# Patient Record
Sex: Male | Born: 1995
Health system: Southern US, Community
[De-identification: ages and names within clinical notes are randomized; demographics above are authoritative.]

## PROBLEM LIST (undated history)

## (undated) DIAGNOSIS — H539 Unspecified visual disturbance: Secondary | ICD-10-CM

## (undated) DIAGNOSIS — R55 Syncope and collapse: Secondary | ICD-10-CM

## (undated) DIAGNOSIS — K589 Irritable bowel syndrome without diarrhea: Secondary | ICD-10-CM

## (undated) HISTORY — DX: Irritable bowel syndrome without diarrhea: K58.9

---
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
## 2020-07-02 ENCOUNTER — Other Ambulatory Visit: Payer: Self-pay

## 2020-07-02 ENCOUNTER — Ambulatory Visit
Admission: RE | Admit: 2020-07-02 | Discharge: 2020-07-02 | Disposition: A | Discharge status: Home / Self Care | Payer: BC Managed Care – PPO | Source: Ambulatory Visit | Attending: Emergency Medicine | Admitting: Emergency Medicine

## 2020-07-02 VITALS — BP 121/76 | HR 60 | Temp 98.4°F | Resp 16

## 2020-07-02 DIAGNOSIS — H6123 Impacted cerumen, bilateral: Secondary | ICD-10-CM

## 2020-07-02 DIAGNOSIS — H6121 Impacted cerumen, right ear: Secondary | ICD-10-CM | POA: Diagnosis not present

## 2020-07-02 NOTE — Discharge Instructions (Addendum)
Below is a list of primary care practices who are taking new patients for you to follow-up with.  [facility] internal medicine clinic Ground Floor - [facility], [address] [place], [place], [place] [address] [phone]  [facility] Primary Care at [facility] [address] [place], [place] [address] [phone]  [facility] [address] [place], [place] [address] [phone]  [facility] [phone] [address] [place] [address]  [facility]: [address] [place] [address]  [phone]  [facility]: [address] [place] [address]   [phone]  [facility]: [address] [place] [address]  [phone]  [facility] : [address] [place] [address] [phone]  [facility] Primary [facility]: [address] [place] [address] [phone]  [facility]: [address] [place] [address] [phone]  Dr. Oneal Grout [address] [facility] [place] [address]  [phone]  Dr. Jackie Plum, [facility]. [address] [place], [place] [address]  [phone]  Go to www.goodrx.com to look up your medications. This will give you a list of where you can find your prescriptions at the most affordable prices. Or ask the pharmacist what the cash price is, or if they have any other discount programs available to help make your medication more affordable. This can be less expensive than what you would pay with insurance.

## 2020-07-02 NOTE — ED Provider Notes (Signed)
HPI  SUBJECTIVE:  Jacob Hernandez is a 25 y.o. male who presents with a sensation of his right ear being "clogged" with muffled hearing for the past 2 days.  He denies ear pain, otorrhea, vertigo, foreign body insertion, nasal congestion.  He tried an earwax removal kit with partial relief of his symptoms, but he states that he felt as if the wax went deeper into his ear.  No complaints in the left ear. he has no past medical history.  PMD: None.    Past Medical History:  Diagnosis Date  . IBS (irritable bowel syndrome)   . IBS (irritable bowel syndrome)   . Syncope    takes Midodrine.  Neurocardiogenic Syncope  . Vision abnormalities    weaqrs contacts    Past Surgical History:  Procedure Laterality Date  . ESOPHAGOGASTRODUODENOSCOPY N/A 12/17/2012   Procedure: ESOPHAGOGASTRODUODENOSCOPY (EGD);  Surgeon: Oletha Blend, MD;  Location: [facility];  Service: Gastroenterology;  Laterality: N/A;    Family History  Problem Relation Age of Onset  . GER disease Maternal Grandmother   . Cholelithiasis Maternal Grandmother   . Arthritis Maternal Grandmother   . Cancer Maternal Grandmother   . Hypertension Maternal Grandmother   . Depression Mother   . Mental illness Mother   . Mental illness Maternal Aunt   . Heart disease Paternal Grandfather   . Hypertension Paternal Grandfather     Social History   Tobacco Use  . Smoking status: Never Smoker  . Smokeless tobacco: Never Used  Substance Use Topics  . Alcohol use: No  . Drug use: No    No current facility-administered medications for this encounter.  Current Outpatient Medications:  .  acetaminophen (TYLENOL) 500 MG tablet, Take 1,000 mg by mouth as needed for pain. , Disp: , Rfl:  .  hyoscyamine (LEVBID) 0.375 MG 12 hr tablet, Take 0.375 mg by mouth every 12 (twelve) hours as needed for cramping., Disp: , Rfl:  .  ibuprofen (ADVIL,MOTRIN) 200 MG tablet, Take 400 mg by mouth every 6 (six) hours as needed for pain., Disp:  , Rfl:  .  midodrine (PROAMATINE) 5 MG tablet, Take 5 mg by mouth 3 (three) times daily., Disp: , Rfl:  .  nortriptyline (PAMELOR) 10 MG capsule, Take 10 mg by mouth at bedtime., Disp: , Rfl:  .  ondansetron (ZOFRAN) 4 MG tablet, Take 4 mg by mouth every 8 (eight) hours as needed for nausea., Disp: , Rfl:   Allergies  Allergen Reactions  . Fig Extract [Ficus] Hives  . Penicillins Hives     ROS  As noted in HPI.   Physical Exam  BP 121/76 (BP Location: Left Arm)   Pulse 60   Temp 98.4 F (36.9 C) (Oral)   Resp 16   SpO2 97%   Constitutional: Well developed, well nourished, no acute distress Eyes:  EOMI, conjunctiva normal bilaterally HENT: Normocephalic, atraumatic,mucus membranes moist.  Right TM partially obscured with cerumen.  Decreased hearing compared to left.  Left TM completely obscured with soft cerumen.  No nasal congestion Neck: No cervical lymphadenopathy. Respiratory: Normal inspiratory effort Cardiovascular: Normal rate GI: nondistended skin: No rash, skin intact Musculoskeletal: no deformities Neurologic: Alert & oriented x 3, no focal neuro deficits Psychiatric: Speech and behavior appropriate   ED Course   Medications - No data to display  Orders Placed This Encounter  Procedures  . Ear wax removal    right    Standing Status:   Standing  Number of Occurrences:   1    No results found for this or any previous visit (from the past 24 hour(s)). No results found.  ED Clinical Impression  1. Hearing loss of right ear due to cerumen impaction   2. Impacted cerumen of both ears      ED Assessment/Plan  Patient has cerumen deep in the right ear canal.  We will have this irrigated and make sure there is no perforation, infection.    Both ears were irrigated.  Right TM erythematous, but intact, not dull or bulging.  EAC irritated, but skin intact.  Patient denies any pain in this ear.  Left TM EAC, TM normal.  Hearing grossly normal and equal  bilaterally post irrigation  Patient states he has no pain in his right  ear, but if he does start to have pain, he can call here, and we can call in a prescription of antibiotic eardrops for an otitis externa.  There is no evidence of otitis media.  Follow-up with PMD of choice as needed.  Will provide primary care list for ongoing care, and order assistance in finding a PMD.  Discussed, MDM, treatment plan, and plan for follow-up with patient. patient agrees with plan.   No orders of the defined types were placed in this encounter.     *This clinic note was created using Dragon dictation software. Therefore, there may be occasional mistakes despite careful proofreading.  ?    Melynda Ripple, MD 07/03/20 [phone]

## 2020-07-02 NOTE — ED Triage Notes (Signed)
Pt reports RT ear pain with ringing of Rt ear.

## 2020-11-06 ENCOUNTER — Encounter: Payer: Self-pay | Admitting: Emergency Medicine

## 2020-11-06 ENCOUNTER — Ambulatory Visit
Admission: EM | Admit: 2020-11-06 | Discharge: 2020-11-06 | Disposition: A | Discharge status: Home / Self Care | Payer: BC Managed Care – PPO | Attending: Emergency Medicine | Admitting: Emergency Medicine

## 2020-11-06 ENCOUNTER — Other Ambulatory Visit: Payer: Self-pay

## 2020-11-06 DIAGNOSIS — J069 Acute upper respiratory infection, unspecified: Secondary | ICD-10-CM | POA: Diagnosis not present

## 2020-11-06 MED ORDER — BENZONATATE 100 MG PO CAPS: 100.0000 mg | ORAL_CAPSULE | Freq: Three times a day (TID) | ORAL | 0 refills | Status: AC

## 2020-11-06 MED ORDER — PROMETHAZINE-DM 6.25-15 MG/5ML PO SYRP: 5.0000 mL | ORAL_SOLUTION | Freq: Four times a day (QID) | ORAL | 0 refills | Status: AC | PRN

## 2020-11-06 MED ORDER — FLUTICASONE PROPIONATE 50 MCG/ACT NA SUSP: 1.0000 | Freq: Every day | NASAL | 2 refills | Status: AC

## 2020-11-06 NOTE — Discharge Instructions (Addendum)
We will contact you if your COVID and flu test is positive.   If your test is negative you may resume normal activities.  If your test is positive please continue to quarantine for at least 5 days from your symptom onset or until you are without a fever for at least 24 hours after the medications.   You can take the Tessalon perles as needed for cough.  You can also take the Promethazine DM as needed for cough at night.  Promethazine DM can make you sleepy so don't take it prior to driving.   You can take Tylenol and/or Ibuprofen as needed for fever reduction and pain relief.   For cough: honey 1/2 to 1 teaspoon (you can dilute the honey in water or another fluid).  You can also use guaifenesin and dextromethorphan for cough. You can use a humidifier for chest congestion and cough.  If you don't have a humidifier, you can sit in the bathroom with the hot shower running.      For sore throat: try warm salt water gargles, cepacol lozenges, throat spray, warm tea or water with lemon/honey, popsicles or ice, or OTC cold relief medicine for throat discomfort.   For congestion: take a daily anti-histamine like Zyrtec, Claritin, and a oral decongestant, such as pseudoephedrine.  You can also use Flonase 1-2 sprays in each nostril daily.   It is important to stay hydrated: drink plenty of fluids (water, gatorade/powerade/pedialyte, juices, or teas) to keep your throat moisturized and help further relieve irritation/discomfort.

## 2020-11-06 NOTE — ED Triage Notes (Signed)
Patient c/o URI x 7 days, non-productive cough, nasal congestion, no ear pain.  Home COVID test negative.  Patient has taken Mucinex.  Patient is vaccinated for COVID.

## 2020-11-06 NOTE — ED Provider Notes (Signed)
[facility]    CSN: [id number] Arrival date & time: 11/06/20  1101      History   Chief Complaint Chief Complaint  Patient presents with  . URI    HPI Jacob Hernandez is a 25 y.o. male.   Patient presents with nasal congestion, rhinorrhea, nonproductive cough, mild sore throat for 5 days.  Able to tolerate food and liquids.  Home COVID test 3 days ago was negative.  Patient taken Mucinex with minimal improvement.  No known sick contact.  Vaccinated.  Denies headaches, ear pain or fullness, shortness of breath, wheezing, abdominal pain, nausea, vomiting, diarrhea.  Past Medical History:  Diagnosis Date  . IBS (irritable bowel syndrome)   . IBS (irritable bowel syndrome)   . Syncope    takes Midodrine.  Neurocardiogenic Syncope  . Vision abnormalities    weaqrs contacts    Patient Active Problem List   Diagnosis Date Noted  . Neurologic cardiac syncope 08/16/2012  . Nausea with vomiting 07/28/2012  . Epigastric abdominal pain 07/28/2012  . IBS (irritable bowel syndrome)     Past Surgical History:  Procedure Laterality Date  . ESOPHAGOGASTRODUODENOSCOPY N/A 12/17/2012   Procedure: ESOPHAGOGASTRODUODENOSCOPY (EGD);  Surgeon: Jon Gills, MD;  Location: [facility] OR;  Service: Gastroenterology;  Laterality: N/A;       Home Medications    Prior to Admission medications   Medication Sig Start Date End Date Taking? Authorizing Provider  benzonatate (TESSALON) 100 MG capsule Take 1 capsule (100 mg total) by mouth every 8 (eight) hours. 11/06/20  Yes Mikaelyn Arthurs R, NP  fluticasone (FLONASE) 50 MCG/ACT nasal spray Place 1 spray into both nostrils daily. 11/06/20  Yes Abagael Kramm R, NP  promethazine-dextromethorphan (PROMETHAZINE-DM) 6.25-15 MG/5ML syrup Take 5 mLs by mouth 4 (four) times daily as needed for cough. 11/06/20  Yes Kaylise Blakeley R, NP  acetaminophen (TYLENOL) 500 MG tablet Take 1,000 mg by mouth as needed for pain.     [provider]  hyoscyamine (LEVBID) 0.375 MG 12 hr tablet Take 0.375 mg by mouth every 12 (twelve) hours as needed for cramping.    [provider]  ibuprofen (ADVIL,MOTRIN) 200 MG tablet Take 400 mg by mouth every 6 (six) hours as needed for pain.    [provider]  midodrine (PROAMATINE) 5 MG tablet Take 5 mg by mouth 3 (three) times daily.    [provider]  nortriptyline (PAMELOR) 10 MG capsule Take 10 mg by mouth at bedtime. 12/09/12 12/09/13  Jon Gills, MD  ondansetron (ZOFRAN) 4 MG tablet Take 4 mg by mouth every 8 (eight) hours as needed for nausea.    [provider]    Family History Family History  Problem Relation Age of Onset  . GER disease Maternal Grandmother   . Cholelithiasis Maternal Grandmother   . Arthritis Maternal Grandmother   . Cancer Maternal Grandmother   . Hypertension Maternal Grandmother   . Depression Mother   . Mental illness Mother   . Mental illness Maternal Aunt   . Heart disease Paternal Grandfather   . Hypertension Paternal Grandfather     Social History Social History   Tobacco Use  . Smoking status: Never  . Smokeless tobacco: Never  Substance Use Topics  . Alcohol use: No  . Drug use: No     Allergies   Fig extract [ficus] and Penicillins   Review of Systems Review of Systems Defer to HPI    Physical Exam  Triage Vital Signs ED Triage Vitals  Enc Vitals Group     BP 11/06/20 1131 124/66     Pulse Rate 11/06/20 1131 94     Resp --      Temp 11/06/20 1131 98.4 F (36.9 C)     Temp Source 11/06/20 1131 Oral     SpO2 11/06/20 1131 97 %     Weight 11/06/20 1132 160 lb (72.6 kg)     Height 11/06/20 1132 5\' 10"  (1.778 m)     Head Circumference --      Peak Flow --      Pain Score 11/06/20 1132 0     Pain Loc --      Pain Edu? --      Excl. in GC? --    No data found.  Updated Vital Signs BP 124/66 (BP Location: Left Arm)   Pulse 94   Temp 98.4 F (36.9 C) (Oral)   Ht 5\' 10"  (1.778 m)    Wt 160 lb (72.6 kg)   SpO2 97%   BMI 22.96 kg/m   Visual Acuity Right Eye Distance:   Left Eye Distance:   Bilateral Distance:    Right Eye Near:   Left Eye Near:    Bilateral Near:     Physical Exam Constitutional:      Appearance: Normal appearance. He is normal weight.  HENT:     Head: Normocephalic.     Right Ear: Tympanic membrane, ear canal and external ear normal.     Left Ear: Tympanic membrane, ear canal and external ear normal.     Nose: Congestion and rhinorrhea present.     Mouth/Throat:     Mouth: Mucous membranes are moist.     Pharynx: Posterior oropharyngeal erythema present.  Eyes:     Extraocular Movements: Extraocular movements intact.  Cardiovascular:     Rate and Rhythm: Normal rate and regular rhythm.     Pulses: Normal pulses.     Heart sounds: Normal heart sounds.  Pulmonary:     Effort: Pulmonary effort is normal.     Breath sounds: Examination of the right-upper field reveals wheezing. Examination of the left-upper field reveals wheezing. Wheezing present.  Musculoskeletal:     Cervical back: Normal range of motion.  Lymphadenopathy:     Cervical: Cervical adenopathy present.  Skin:    General: Skin is warm and dry.  Neurological:     Mental Status: He is alert and oriented to person, place, and time. Mental status is at baseline.  Psychiatric:        Mood and Affect: Mood normal.        Behavior: Behavior normal.     UC Treatments / Results  Labs (all labs ordered are listed, but only abnormal results are displayed) Labs Reviewed  COVID-19, FLU A+B NAA    EKG   Radiology No results found.  Procedures Procedures (including critical care time)  Medications Ordered in UC Medications - No data to display  Initial Impression / Assessment and Plan / UC Course  I have reviewed the triage vital signs and the nursing notes.  Pertinent labs & imaging results that were available during my care of the patient were reviewed by me  and considered in my medical decision making (see chart for details).  Viral URI with cough  1.  COVID and flu test pending 2.  Tessalon 100 mg 3 times daily as needed 3. Promethazine DM 6.25-15 mg / 5 mL every  4 hours as needed 4.  Flonase 50 mcg 1 spray each nare daily prn 5.  Over-the-counter medicines for remaining symptom management Final Clinical Impressions(s) / UC Diagnoses   Final diagnoses:  Viral URI with cough     Discharge Instructions      We will contact you if your COVID and flu test is positive.   If your test is negative you may resume normal activities.  If your test is positive please continue to quarantine for at least 5 days from your symptom onset or until you are without a fever for at least 24 hours after the medications.   You can take the Tessalon perles as needed for cough.  You can also take the Promethazine DM as needed for cough at night.  Promethazine DM can make you sleepy so don't take it prior to driving.   You can take Tylenol and/or Ibuprofen as needed for fever reduction and pain relief.   For cough: honey 1/2 to 1 teaspoon (you can dilute the honey in water or another fluid).  You can also use guaifenesin and dextromethorphan for cough. You can use a humidifier for chest congestion and cough.  If you don't have a humidifier, you can sit in the bathroom with the hot shower running.      For sore throat: try warm salt water gargles, cepacol lozenges, throat spray, warm tea or water with lemon/honey, popsicles or ice, or OTC cold relief medicine for throat discomfort.   For congestion: take a daily anti-histamine like Zyrtec, Claritin, and a oral decongestant, such as pseudoephedrine.  You can also use Flonase 1-2 sprays in each nostril daily.   It is important to stay hydrated: drink plenty of fluids (water, gatorade/powerade/pedialyte, juices, or teas) to keep your throat moisturized and help further relieve irritation/discomfort.         ED  Prescriptions     Medication Sig Dispense Auth. Provider   benzonatate (TESSALON) 100 MG capsule Take 1 capsule (100 mg total) by mouth every 8 (eight) hours. 21 capsule Skilar Marcou R, NP   promethazine-dextromethorphan (PROMETHAZINE-DM) 6.25-15 MG/5ML syrup Take 5 mLs by mouth 4 (four) times daily as needed for cough. 118 mL Arkeem Harts R, NP   fluticasone (FLONASE) 50 MCG/ACT nasal spray Place 1 spray into both nostrils daily. 11.1 g Valinda Hoar, NP      PDMP not reviewed this encounter.   Valinda Hoar, [place] 11/06/20 1202

## 2020-11-07 LAB — COVID-19, FLU A+B NAA
Influenza A, NAA: NOT DETECTED
Influenza B, NAA: NOT DETECTED
SARS-CoV-2, NAA: DETECTED — AB

## 2021-02-08 ENCOUNTER — Other Ambulatory Visit: Payer: Self-pay

## 2021-02-08 ENCOUNTER — Ambulatory Visit
Admission: EM | Admit: 2021-02-08 | Discharge: 2021-02-08 | Disposition: A | Discharge status: Home / Self Care | Payer: BC Managed Care – PPO | Attending: Internal Medicine | Admitting: Internal Medicine

## 2021-02-08 ENCOUNTER — Encounter: Payer: Self-pay | Admitting: Emergency Medicine

## 2021-02-08 DIAGNOSIS — K580 Irritable bowel syndrome with diarrhea: Secondary | ICD-10-CM | POA: Diagnosis not present

## 2021-02-08 MED ORDER — ONDANSETRON 4 MG PO TBDP: 4.0000 mg | ORAL_TABLET | Freq: Three times a day (TID) | ORAL | 0 refills | Status: AC | PRN

## 2021-02-08 MED ORDER — ONDANSETRON 4 MG PO TBDP
4.0000 mg | ORAL_TABLET | Freq: Once | ORAL | Status: AC
  Administered 2021-02-08: 4 mg via ORAL

## 2021-02-08 MED ORDER — DICYCLOMINE HCL 20 MG PO TABS: 20.0000 mg | ORAL_TABLET | Freq: Two times a day (BID) | ORAL | 0 refills | Status: AC

## 2021-02-08 NOTE — ED Provider Notes (Signed)
Jacob Hernandez    CSN: [id number] Arrival date & time: 02/08/21  1120      History   Chief Complaint Chief Complaint  Patient presents with   Nausea   Fatigue    HPI Jacob Hernandez is a 25 y.o. male comes to the urgent [facility] with 2-day history of lower abdominal pain, diarrhea and episode of nausea this morning.  Patient has a history of irritable bowel syndrome manifested by diarrhea.  Couple of days ago patient presumed that this was a flareup of his IBS.  However, patient started experiencing some fatigue and generalized body aches hence the visit to the urgent [facility].  No fever or chills.  No change in dietary habits.  No sick contacts.  No upper respiratory infection symptoms.  No blood or mucus in stool.  No emesis.Marland Kitchen   HPI  Past Medical History:  Diagnosis Date   IBS (irritable bowel syndrome)    IBS (irritable bowel syndrome)    Syncope    takes Midodrine.  Neurocardiogenic Syncope   Vision abnormalities    weaqrs contacts    Patient Active Problem List   Diagnosis Date Noted   Neurologic cardiac syncope 08/16/2012   Nausea with vomiting 07/28/2012   Epigastric abdominal pain 07/28/2012   IBS (irritable bowel syndrome)     Past Surgical History:  Procedure Laterality Date   ESOPHAGOGASTRODUODENOSCOPY N/A 12/17/2012   Procedure: ESOPHAGOGASTRODUODENOSCOPY (EGD);  Surgeon: Jon Gills, MD;  Location: [facility] OR;  Service: Gastroenterology;  Laterality: N/A;       Home Medications    Prior to Admission medications   Medication Sig Start Date End Date Taking? Authorizing Provider  dicyclomine (BENTYL) 20 MG tablet Take 1 tablet (20 mg total) by mouth 2 (two) times daily. 02/08/21  Yes Addeline Calarco, Britta Mccreedy, MD  ondansetron (ZOFRAN ODT) 4 MG disintegrating tablet Take 1 tablet (4 mg total) by mouth every 8 (eight) hours as needed for nausea or vomiting. 02/08/21  Yes Karli Wickizer, Britta Mccreedy, MD  acetaminophen (TYLENOL) 500 MG tablet Take 1,000 mg by mouth  as needed for pain.     [provider]  benzonatate (TESSALON) 100 MG capsule Take 1 capsule (100 mg total) by mouth every 8 (eight) hours. 11/06/20   White, Elita Boone, NP  fluticasone (FLONASE) 50 MCG/ACT nasal spray Place 1 spray into both nostrils daily. 11/06/20   White, Elita Boone, NP  hyoscyamine (LEVBID) 0.375 MG 12 hr tablet Take 0.375 mg by mouth every 12 (twelve) hours as needed for cramping.    [provider]  ibuprofen (ADVIL,MOTRIN) 200 MG tablet Take 400 mg by mouth every 6 (six) hours as needed for pain.    [provider]  midodrine (PROAMATINE) 5 MG tablet Take 5 mg by mouth 3 (three) times daily.    [provider]  nortriptyline (PAMELOR) 10 MG capsule Take 10 mg by mouth at bedtime. 12/09/12 12/09/13  Jon Gills, MD  promethazine-dextromethorphan (PROMETHAZINE-DM) 6.25-15 MG/5ML syrup Take 5 mLs by mouth 4 (four) times daily as needed for cough. 11/06/20   Valinda Hoar, NP    Family History Family History  Problem Relation Age of Onset   GER disease Maternal Grandmother    Cholelithiasis Maternal Grandmother    Arthritis Maternal Grandmother    Cancer Maternal Grandmother    Hypertension Maternal Grandmother    Depression Mother    Mental illness Mother    Mental illness Maternal Aunt    Heart disease  Paternal Grandfather    Hypertension Paternal Grandfather     Social History Social History   Tobacco Use   Smoking status: Never   Smokeless tobacco: Never  Substance Use Topics   Alcohol use: No   Drug use: No     Allergies   Fig extract [ficus] and Penicillins   Review of Systems Review of Systems  Respiratory: Negative.    Gastrointestinal:  Positive for abdominal pain, diarrhea and nausea. Negative for vomiting.  Genitourinary: Negative.   Neurological: Negative.     Physical Exam Triage Vital Signs ED Triage Vitals [02/08/21 1159]  Enc Vitals Group     BP (!) 142/95     Pulse Rate 73     Resp 16      Temp 98.2 F (36.8 C)     Temp Source Oral     SpO2 98 %     Weight      Height      Head Circumference      Peak Flow      Pain Score 0     Pain Loc      Pain Edu?      Excl. in GC?    No data found.  Updated Vital Signs BP (!) 142/95 (BP Location: Left Arm)   Pulse 73   Temp 98.2 F (36.8 C) (Oral)   Resp 16   SpO2 98%   Visual Acuity Right Eye Distance:   Left Eye Distance:   Bilateral Distance:    Right Eye Near:   Left Eye Near:    Bilateral Near:     Physical Exam Vitals and nursing note reviewed.  Constitutional:      General: He is not in acute distress.    Appearance: He is not ill-appearing.  Cardiovascular:     Rate and Rhythm: Normal rate and regular rhythm.     Pulses: Normal pulses.     Heart sounds: Normal heart sounds.  Pulmonary:     Effort: Pulmonary effort is normal.     Breath sounds: Normal breath sounds.  Abdominal:     General: Bowel sounds are normal. There is no distension.     Palpations: Abdomen is soft.     Tenderness: There is no abdominal tenderness.     Hernia: No hernia is present.  Neurological:     Mental Status: He is alert.     UC Treatments / Results  Labs (all labs ordered are listed, but only abnormal results are displayed) Labs Reviewed  CBC WITH DIFFERENTIAL/PLATELET    EKG   Radiology No results found.  Procedures Procedures (including critical [facility] time)  Medications Ordered in UC Medications  ondansetron (ZOFRAN-ODT) disintegrating tablet 4 mg (4 mg Oral Given 02/08/21 1202)    Initial Impression / Assessment and Plan / UC Course  I have reviewed the triage vital signs and the nursing notes.  Pertinent labs & imaging results that were available during my [facility] of the patient were reviewed by me and considered in my medical decision making (see chart for details).     1.  Irritable bowel syndrome with diarrhea: Zofran as needed for nausea Bentyl as needed for abdominal cramps CBC Maintain  adequate hydration If symptoms worsen please return to urgent [facility] to be reevaluated. Final Clinical Impressions(s) / UC Diagnoses   Final diagnoses:  Irritable bowel syndrome with diarrhea     Discharge Instructions      This take medications as prescribed Increase oral fluid  intake If symptoms worsen please call your primary [facility] physician to evaluate if available We will call you with recommendations if labs are abnormal.     ED Prescriptions     Medication Sig Dispense Auth. Provider   ondansetron (ZOFRAN ODT) 4 MG disintegrating tablet Take 1 tablet (4 mg total) by mouth every 8 (eight) hours as needed for nausea or vomiting. 20 tablet Osie Merkin, Britta Mccreedy, MD   dicyclomine (BENTYL) 20 MG tablet Take 1 tablet (20 mg total) by mouth 2 (two) times daily. 20 tablet Aryn Safran, Britta Mccreedy, MD      PDMP not reviewed this encounter.   Merrilee Jansky, MD 02/08/21 1438

## 2021-02-08 NOTE — ED Triage Notes (Signed)
Diarrhea, sharp central lower abdominal pain that is intermittent, nausea since Wednesday. Hx of IBS. States he woke up this morning with new nausea and fatigue, generally not feeling well, which he says is different from his baseline. Denies fever

## 2021-02-08 NOTE — Discharge Instructions (Addendum)
This take medications as prescribed Increase oral fluid intake If symptoms worsen please call your primary care physician to evaluate if available We will call you with recommendations if labs are abnormal.

## 2021-02-09 LAB — CBC WITH DIFFERENTIAL/PLATELET
Basophils Absolute: 0.1 10*3/uL (ref 0.0–0.2)
Basos: 1 %
EOS (ABSOLUTE): 0.1 10*3/uL (ref 0.0–0.4)
Eos: 2 %
Hematocrit: 49.3 % (ref 37.5–51.0)
Hemoglobin: 17.5 g/dL (ref 13.0–17.7)
Immature Grans (Abs): 0 10*3/uL (ref 0.0–0.1)
Immature Granulocytes: 0 %
Lymphocytes Absolute: 1.8 10*3/uL (ref 0.7–3.1)
Lymphs: 29 %
MCH: 32.6 pg (ref 26.6–33.0)
MCHC: 35.5 g/dL (ref 31.5–35.7)
MCV: 92 fL (ref 79–97)
Monocytes Absolute: 0.5 10*3/uL (ref 0.1–0.9)
Monocytes: 8 %
Neutrophils Absolute: 3.6 10*3/uL (ref 1.4–7.0)
Neutrophils: 60 %
Platelets: 278 10*3/uL (ref 150–450)
RBC: 5.37 x10E6/uL (ref 4.14–5.80)
RDW: 12.2 % (ref 11.6–15.4)
WBC: 6 10*3/uL (ref 3.4–10.8)
